# Patient Record
Sex: Female | Born: 1970 | Race: White | Hispanic: No | Marital: Married
Health system: Southern US, Community
[De-identification: ages and names within clinical notes are randomized; demographics above are authoritative.]

---

## 1998-09-20 ENCOUNTER — Encounter: Payer: Self-pay | Admitting: Neurology

## 1998-09-20 ENCOUNTER — Ambulatory Visit (HOSPITAL_COMMUNITY): Admission: RE | Admit: 1998-09-20 | Discharge: 1998-09-20 | Payer: Self-pay | Admitting: Neurology

## 2000-07-11 ENCOUNTER — Other Ambulatory Visit: Admission: RE | Admit: 2000-07-11 | Discharge: 2000-07-11 | Payer: Self-pay | Admitting: Obstetrics and Gynecology

## 2000-07-21 ENCOUNTER — Inpatient Hospital Stay (HOSPITAL_COMMUNITY): Admission: AD | Admit: 2000-07-21 | Discharge: 2000-07-21 | Payer: Self-pay | Admitting: Obstetrics and Gynecology

## 2001-02-03 ENCOUNTER — Encounter (INDEPENDENT_AMBULATORY_CARE_PROVIDER_SITE_OTHER): Payer: Self-pay | Admitting: Specialist

## 2001-02-04 ENCOUNTER — Inpatient Hospital Stay (HOSPITAL_COMMUNITY): Admission: AD | Admit: 2001-02-04 | Discharge: 2001-02-06 | Payer: Self-pay | Admitting: Obstetrics and Gynecology

## 2001-03-24 ENCOUNTER — Encounter: Payer: Self-pay | Admitting: Emergency Medicine

## 2001-03-24 ENCOUNTER — Emergency Department (HOSPITAL_COMMUNITY): Admission: EM | Admit: 2001-03-24 | Discharge: 2001-03-24 | Payer: Self-pay | Admitting: Emergency Medicine

## 2001-04-04 ENCOUNTER — Other Ambulatory Visit: Admission: RE | Admit: 2001-04-04 | Discharge: 2001-04-04 | Payer: Self-pay | Admitting: Obstetrics and Gynecology

## 2002-08-19 ENCOUNTER — Other Ambulatory Visit: Admission: RE | Admit: 2002-08-19 | Discharge: 2002-08-19 | Payer: Self-pay | Admitting: Obstetrics and Gynecology

## 2003-10-12 ENCOUNTER — Other Ambulatory Visit: Admission: RE | Admit: 2003-10-12 | Discharge: 2003-10-12 | Payer: Self-pay | Admitting: Obstetrics and Gynecology

## 2004-02-19 ENCOUNTER — Encounter: Admission: RE | Admit: 2004-02-19 | Discharge: 2004-02-19 | Payer: Self-pay | Admitting: Obstetrics and Gynecology

## 2004-03-18 ENCOUNTER — Encounter: Admission: RE | Admit: 2004-03-18 | Discharge: 2004-03-18 | Payer: Self-pay | Admitting: Obstetrics and Gynecology

## 2004-05-12 ENCOUNTER — Encounter (HOSPITAL_COMMUNITY): Admission: RE | Admit: 2004-05-12 | Discharge: 2004-08-10 | Payer: Self-pay | Admitting: Internal Medicine

## 2005-05-23 ENCOUNTER — Other Ambulatory Visit: Admission: RE | Admit: 2005-05-23 | Discharge: 2005-05-23 | Payer: Self-pay | Admitting: Obstetrics and Gynecology

## 2005-10-04 ENCOUNTER — Encounter (INDEPENDENT_AMBULATORY_CARE_PROVIDER_SITE_OTHER): Payer: Self-pay | Admitting: Specialist

## 2005-10-04 ENCOUNTER — Ambulatory Visit (HOSPITAL_COMMUNITY): Admission: RE | Admit: 2005-10-04 | Discharge: 2005-10-04 | Payer: Self-pay | Admitting: Obstetrics and Gynecology

## 2007-11-29 DIAGNOSIS — M129 Arthropathy, unspecified: Secondary | ICD-10-CM | POA: Insufficient documentation

## 2007-11-29 DIAGNOSIS — R109 Unspecified abdominal pain: Secondary | ICD-10-CM | POA: Insufficient documentation

## 2007-11-29 DIAGNOSIS — E039 Hypothyroidism, unspecified: Secondary | ICD-10-CM | POA: Insufficient documentation

## 2007-11-29 DIAGNOSIS — N809 Endometriosis, unspecified: Secondary | ICD-10-CM | POA: Insufficient documentation

## 2007-12-02 ENCOUNTER — Ambulatory Visit: Payer: Self-pay | Admitting: Internal Medicine

## 2007-12-02 DIAGNOSIS — Z9889 Other specified postprocedural states: Secondary | ICD-10-CM | POA: Insufficient documentation

## 2007-12-05 ENCOUNTER — Ambulatory Visit (HOSPITAL_COMMUNITY): Admission: RE | Admit: 2007-12-05 | Discharge: 2007-12-05 | Payer: Self-pay | Admitting: Internal Medicine

## 2007-12-11 ENCOUNTER — Telehealth: Payer: Self-pay | Admitting: Internal Medicine

## 2010-01-24 IMAGING — US US ABDOMEN COMPLETE
1 series · 14 of 25 positions shown · non-contrast
Comparison: None available

CLINICAL DATA: Right upper abdominal pain

ABDOMEN ULTRASOUND
TECHNIQUE: Complete abdominal ultrasound examination was performed
including evaluation of the liver, gallbladder, bile ducts,
pancreas, kidneys, spleen, IVC, and abdominal aorta.

[Series 1: unknown · 0.27mm/px · 14 of 60 slices shown]
[im 1/60]
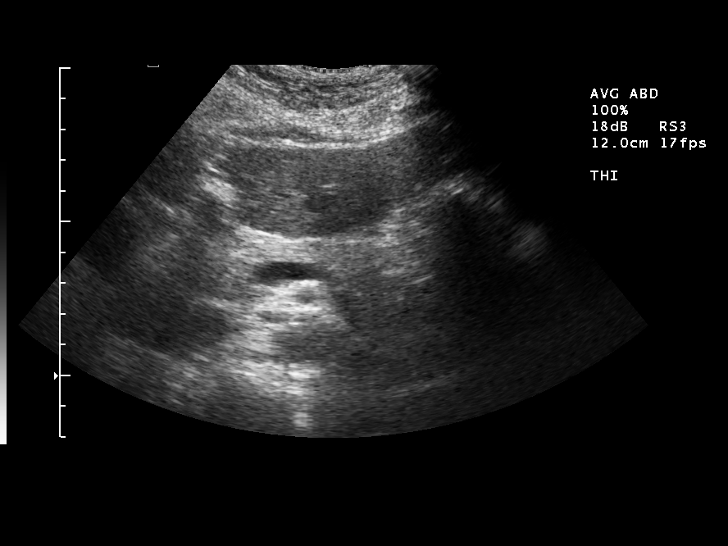
[im 5/60]
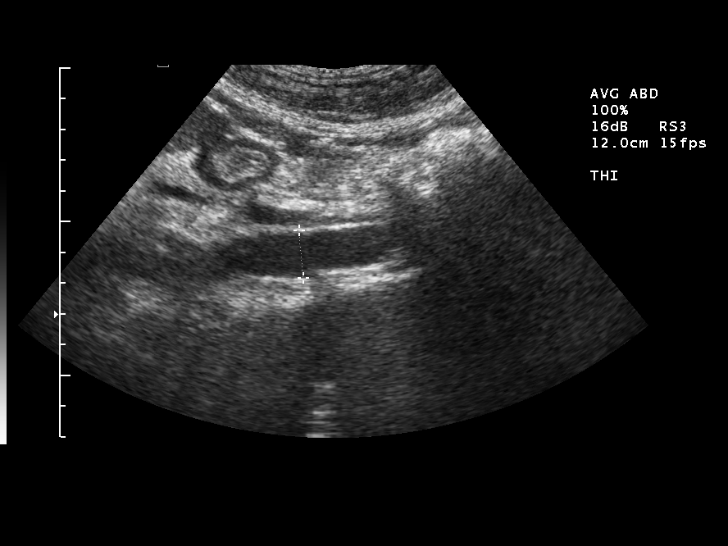
[im 10/60]
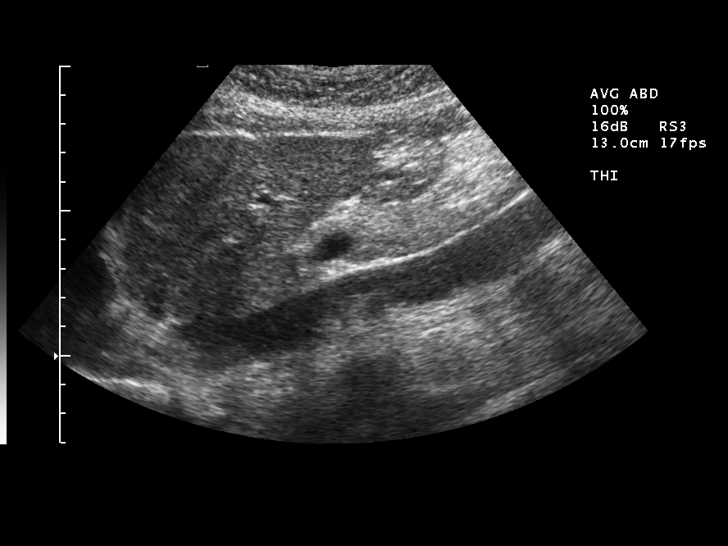
[im 15/60]
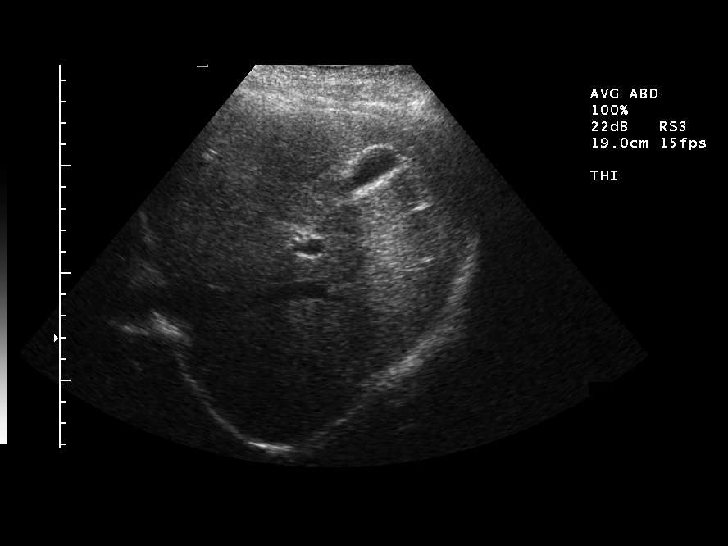
[im 20/60]
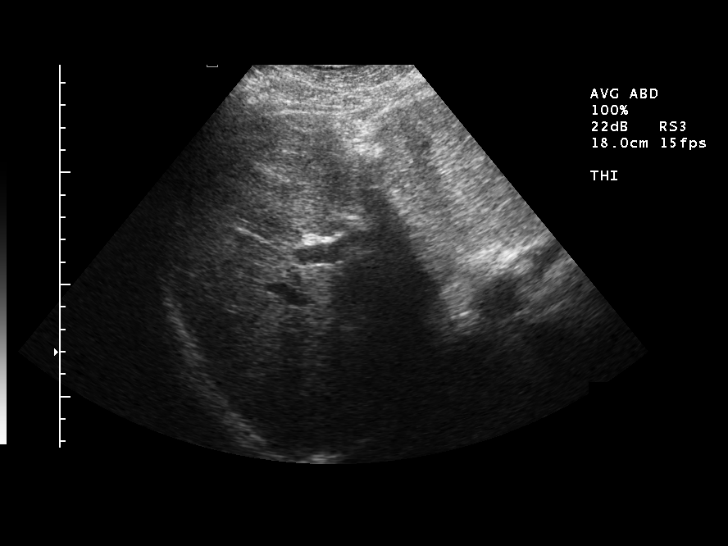
[im 23/60]
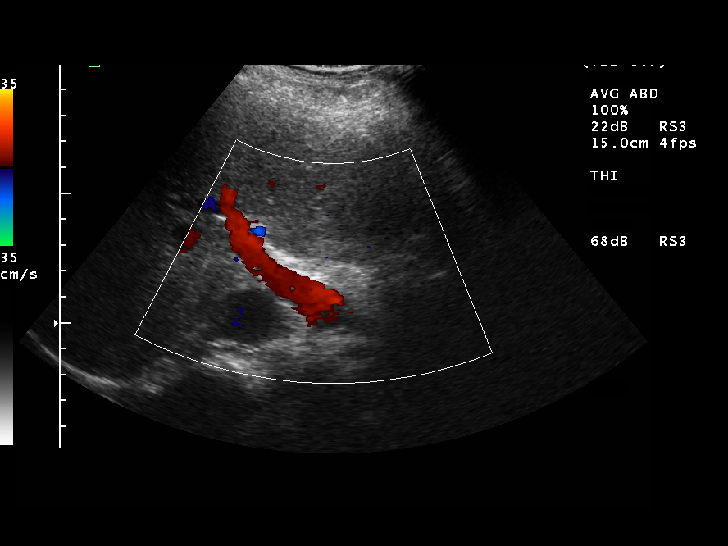
[im 28/60]
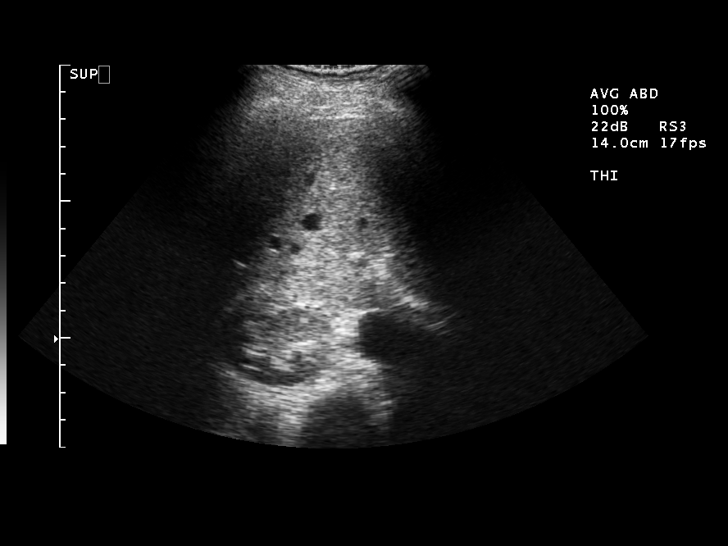
[im 32/60]
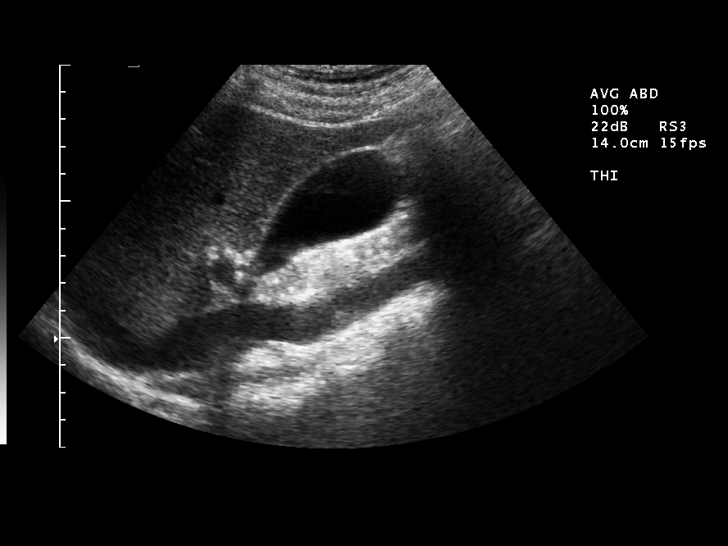
[im 37/60]
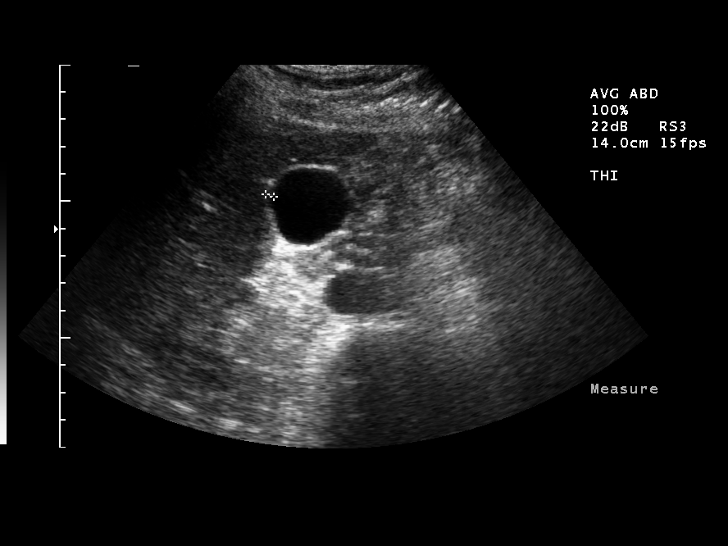
[im 40/60]
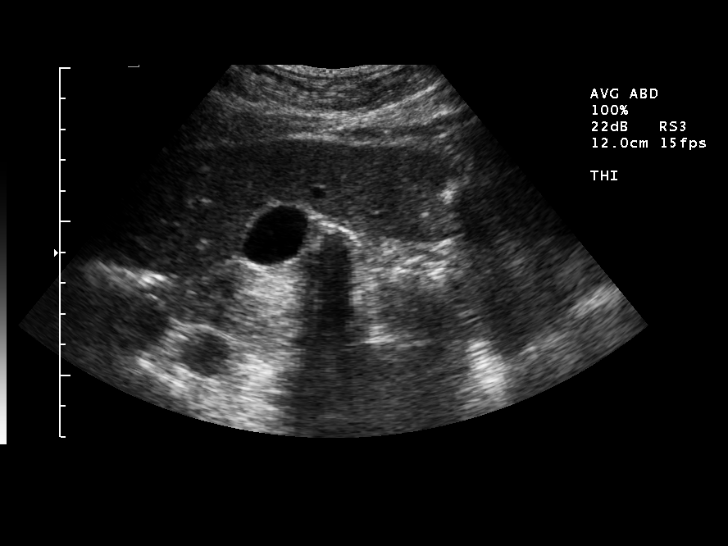
[im 45/60]
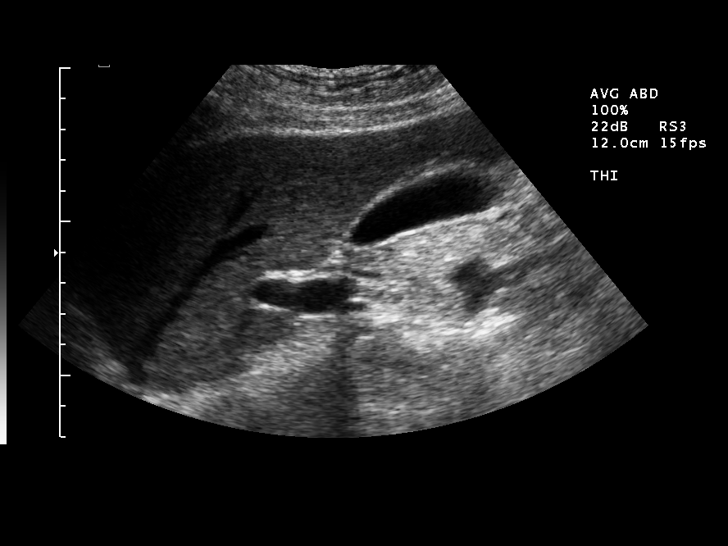
[im 50/60]
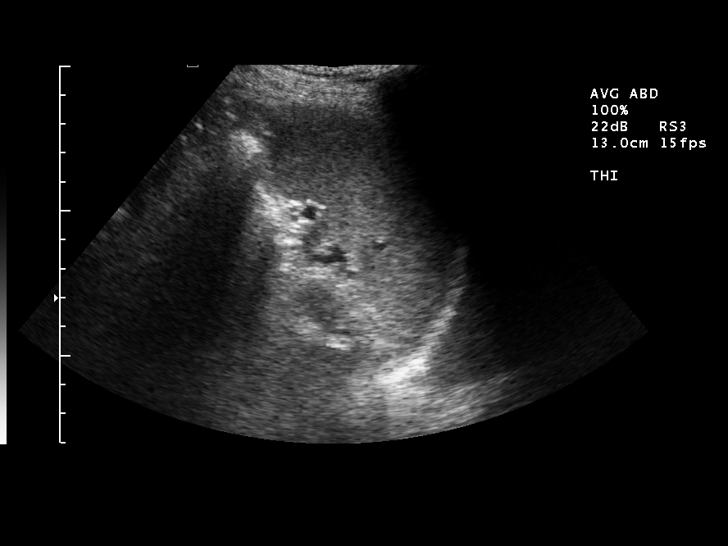
[im 55/60]
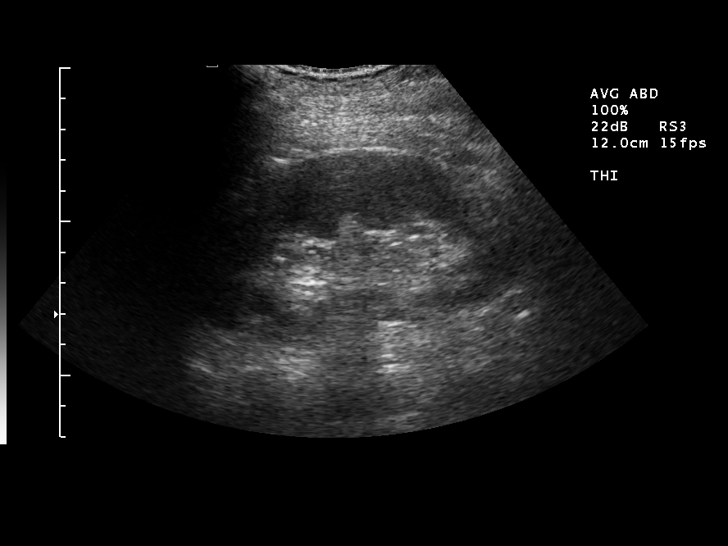
[im 60/60]
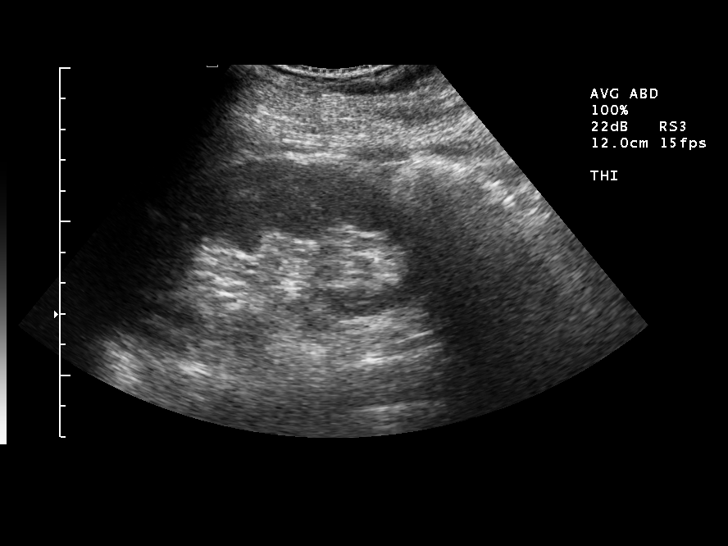

[14 of 25 positions shown; findings below may reference images not displayed]

FINDINGS: Gallbladder:  No gallstones.  No gallbladder wall thickening or
pericholecystic fluid. Negative sonographic Murphy's sign per the
ultrasound technologist.

Common bile duct: Normal in caliber.

Liver:  Normal size and echotexture.  No focal parenchymal
abnormalities.

Inferior vena cava:  Patent.

Pancreas:  Visualized portions unremarkable.

Spleen:  Normal size and echotexture without focal parenchymal
abnormalities.

Right kidney:  No hydronephrosis.   Normal parenchymal echotexture
without focal abnormalities.

Left kidney:  No hydronephrosis. Normal parenchymal echotexture
without focal abnormalities.

Abdominal aorta:  Visualized portions normal in caliber,
unremarkable..
IMPRESSION: Normal abdominal ultrasound.

## 2010-11-11 NOTE — Op Note (Signed)
[name], [name]               ACCOUNT NO.:  [id number]   MEDICAL RECORD NO.:  [id number]          PATIENT TYPE:  AMB   LOCATION:  [facility]                           FACILITY:  [facility]   PHYSICIAN:  Guy Sandifer. Henderson Cloud, M.D. DATE OF BIRTH:  05-Apr-1971   DATE OF PROCEDURE:  10/04/2005  DATE OF DISCHARGE:                                 OPERATIVE REPORT   PREOPERATIVE DIAGNOSES:  1.  Dysmenorrhea.  2.  Menorrhagia.   POSTOPERATIVE DIAGNOSIS:  Endometriosis.   PROCEDURE:  Laparoscopy with ablation of endometriosis, hysteroscopy,  dilatation curettage and 1% Xylocaine paracervical block.   SURGEON:  Guy Sandifer. Henderson Cloud, M.D.   ANESTHESIA:  General with endotracheal intubation.   SPECIMENS:  Endometrial curettings.   ESTIMATED BLOOD LOSS:  Minimal.   I&O OF SORBITOL DISTENDING MEDIA:  40 mL deficit.   INDICATIONS AND CONSENT:  This patient is a 40 year old married white  female, G5, P3, husband status post vasectomy, having premenstrual pain and  heavy menses.  Details are dictated in the history and physical.  Hysteroscopy, dilatation curettage, laparoscopy is discussed.  Potential  risks and complications have been reviewed preoperatively including but  limited to infection, uterine perforation, bowel, bladder or ureteral  damage, bleeding requiring transfusion of blood products with possible  transfusion reaction, HIV and hepatitis acquisition, DVT, PE, pneumonia,  recurrent pain and/or heavy bleeding.  All questions were answered and  consent signed on the chart.   FINDINGS:  Upper abdomen is grossly normal.  Uterus is normal in contour.  Anterior cul-de-sac is normal.  There are red to black implants of  endometriosis in the posterior cul-de-sac, on the left uterosacral ligament  and a single dark, black implant of endometriosis on the left ovary.  There  is a 2 cm translucent paratubal cyst on the left fallopian tube.   PROCEDURE:  The patient is taken to the operating room,  where she is  identified and placed in the dorsal supine position and general anesthesia  was induced via endotracheal intubation.  She is then placed in the dorsal  lithotomy position, where she is prepped abdominally and vaginally, the  bladder is straight catheterized and she is draped in a sterile fashion.  A  bivalve speculum is placed in vagina and the anterior cervical lip is  injected with 1% Xylocaine and grasped with a single-tooth tenaculum.  A  paracervical block was placed at the 2, 4, 5, 7, 8 and 10 o'clock positions  with approximately 20 mL total of 1% plain Xylocaine.  The cervix is gently  progressively dilated to a 27 dilator.  The diagnostic hysteroscope is  placed in the endocervical canal and advanced under direct visualization.  No abnormal structures were noted.  Hysteroscope is removed.  Sharp  curettage is carried out.  Hysteroscope is then once again advanced under  direct visualization using sorbitol distending media into endometrial  cavity, and no abnormal structures were noted.  The hysteroscope is removed  and the single-tooth tenaculum is replaced with a Hulka tenaculum.  Attention is then turned to the abdomen.  The infraumbilical area and  the  suprapubic area are injected in the midline with 0.5% plain Marcaine.  Small  infraumbilical incision is made and a disposable Veress needle is placed  with a normal syringe and drop test.  Two liters of gas are insufflated  under low pressure with good tympany in the right upper quadrant.  The  Veress needle was removed and a 10/11 bladeless disposable XL trocar sleeve  [place] placed using direct visualization using the diagnostic laparoscopic.  Diagnostic scope is then replaced with the operative laparoscope and a small  suprapubic incision is made.  A 5 mm disposable bladeless XL trocar is then  placed under direct visualization without difficulty.  The above findings  were noted.  The bipolar cautery is used to  ablate the areas of  endometriosis.  Care is taken to avoid the course of the ureter.  The left  paratubal cyst is very thin-walled and simply breaks when grasped and  drains.  The suprapubic trocar sleeve is then removed.  Good hemostasis is  noted all around.  The pneumoperitoneum is completely reduced, the umbilical  trocar sleeve is removed.  Vicryl 0 suture is used in subcutaneous stitch to  reapproximate the umbilical incision.  Dermabond is placed on both  incisions.  The Hulka tenaculum is removed and good hemostasis is noted.  All counts are correct.  The patient is transferred to the recovery room in  stable condition.      Guy Sandifer Henderson Cloud, M.D.  Electronically Signed     JET/MEDQ  D:  10/04/2005  T:  10/04/2005  Job:  [id number]

## 2010-11-11 NOTE — H&P (Signed)
[name], [name]               ACCOUNT NO.:  [id number]   MEDICAL RECORD NO.:  [id number]          PATIENT TYPE:  AMB   LOCATION:  [facility]                           FACILITY:  [facility]   PHYSICIAN:  Guy Sandifer. Henderson Cloud, M.D. DATE OF BIRTH:  18-Dec-1970   DATE OF ADMISSION:  10/04/2005  DATE OF DISCHARGE:                                HISTORY & PHYSICAL   CHIEF COMPLAINT:  Painful, heavy menses.   HISTORY OF PRESENT ILLNESS:  This patient is a 40 year old married white  female, G5, P3, husband status post vasectomy, who is having predictable  premenstrual pain three days before her menses.  Menses are also becoming  increasingly heavy for at least two days every cycle.  Ultrasound in  February of this year was consistent with a 1.1 cm polypoid-type mass in the  endometrial cavity and a 1.7 hemorrhagic cyst on the left ovary.  After  discussion of options, she is being admitted for laparoscopy, hysteroscopy,  dilatation and curettage.  Potential risks and complications have been  discussed preoperatively.   PAST MEDICAL HISTORY:  1.  Hypothyroidism.  2.  Kidney stones in 1997.  3.  History of migraines.  4.  Pneumonia, age 5.   FAMILY HISTORY:  Heart disease, maternal grandmother, maternal grandfather.  Chronic hypertension, maternal grandmother, maternal grandfather, mother,  father.  Migraine headaches, diabetes, maternal grandmother.   OBSTETRICAL HISTORY:  Vaginal deliveries x3.  Miscarriage x2.   SOCIAL HISTORY:  Denies tobacco, alcohol, or drug abuse.   MEDICATIONS:  Synthroid daily.  Ambien p.r.n.   ALLERGIES:  CODEINE leading to hives.   REVIEW OF SYSTEMS:  NEURO:  History of migraine.  CARDIO:  Denies chest  pain.  PULMONARY:  Denies shortness of breath.  GI:  Denies recent changes  in bowel habits.   PHYSICAL EXAMINATION:  VITAL SIGNS:  Height 5 feet 9 inches.  Weight 138-1/2  pounds.  Blood pressure 96/66.  HEENT:  NECK:  Without thyromegaly.  LUNGS:  Clear to  auscultation.  HEART:  Regular rate and rhythm.  BACK:  Without CVA tenderness.  BREASTS:  Without mass, __________, discharge.  ABDOMEN:  Soft and nontender without masses.  PELVIC:  Vulva, vagina, and cervix without lesion.  Uterus is retroverted,  upper normal size.  Mobile.  Mildly tender.  Adnexa nontender without  masses.  EXTREMITIES:  Grossly within normal limits.  NEUROLOGIC:  Grossly within normal limits.   ASSESSMENT:  Dysmenorrhea and menorrhagia.   PLAN:  Laparoscopy, hysteroscopy, D&C.      Guy Sandifer Henderson Cloud, M.D.  Electronically Signed     JET/MEDQ  D:  10/02/2005  T:  10/02/2005  Job:  [id number]

## 2014-06-04 ENCOUNTER — Other Ambulatory Visit: Payer: Self-pay | Admitting: Obstetrics and Gynecology

## 2014-06-05 LAB — CYTOLOGY - PAP

## 2015-11-18 DIAGNOSIS — E038 Other specified hypothyroidism: Secondary | ICD-10-CM | POA: Diagnosis not present

## 2015-11-18 DIAGNOSIS — R61 Generalized hyperhidrosis: Secondary | ICD-10-CM | POA: Diagnosis not present

## 2015-11-18 DIAGNOSIS — R631 Polydipsia: Secondary | ICD-10-CM | POA: Diagnosis not present

## 2015-11-18 DIAGNOSIS — N926 Irregular menstruation, unspecified: Secondary | ICD-10-CM | POA: Diagnosis not present

## 2015-12-15 ENCOUNTER — Other Ambulatory Visit: Payer: Self-pay | Admitting: Internal Medicine

## 2015-12-15 ENCOUNTER — Other Ambulatory Visit: Payer: Self-pay

## 2015-12-15 DIAGNOSIS — G43909 Migraine, unspecified, not intractable, without status migrainosus: Secondary | ICD-10-CM | POA: Diagnosis not present

## 2015-12-15 DIAGNOSIS — R42 Dizziness and giddiness: Secondary | ICD-10-CM

## 2015-12-15 DIAGNOSIS — Z6822 Body mass index (BMI) 22.0-22.9, adult: Secondary | ICD-10-CM | POA: Diagnosis not present

## 2015-12-15 DIAGNOSIS — G43919 Migraine, unspecified, intractable, without status migrainosus: Secondary | ICD-10-CM

## 2015-12-17 ENCOUNTER — Ambulatory Visit
Admission: RE | Admit: 2015-12-17 | Discharge: 2015-12-17 | Disposition: A | Discharge status: Home / Self Care | Payer: BLUE CROSS/BLUE SHIELD | Source: Ambulatory Visit | Attending: Internal Medicine | Admitting: Internal Medicine

## 2015-12-17 DIAGNOSIS — G43919 Migraine, unspecified, intractable, without status migrainosus: Secondary | ICD-10-CM

## 2015-12-17 DIAGNOSIS — R42 Dizziness and giddiness: Secondary | ICD-10-CM

## 2016-01-03 DIAGNOSIS — E038 Other specified hypothyroidism: Secondary | ICD-10-CM | POA: Diagnosis not present

## 2016-03-09 DIAGNOSIS — Z3202 Encounter for pregnancy test, result negative: Secondary | ICD-10-CM | POA: Diagnosis not present

## 2016-03-09 DIAGNOSIS — Z113 Encounter for screening for infections with a predominantly sexual mode of transmission: Secondary | ICD-10-CM | POA: Diagnosis not present

## 2016-03-09 DIAGNOSIS — N760 Acute vaginitis: Secondary | ICD-10-CM | POA: Diagnosis not present

## 2016-03-09 DIAGNOSIS — N939 Abnormal uterine and vaginal bleeding, unspecified: Secondary | ICD-10-CM | POA: Diagnosis not present

## 2016-04-10 DIAGNOSIS — Z6822 Body mass index (BMI) 22.0-22.9, adult: Secondary | ICD-10-CM | POA: Diagnosis not present

## 2016-04-10 DIAGNOSIS — G43909 Migraine, unspecified, not intractable, without status migrainosus: Secondary | ICD-10-CM | POA: Diagnosis not present

## 2016-04-10 DIAGNOSIS — F418 Other specified anxiety disorders: Secondary | ICD-10-CM | POA: Diagnosis not present

## 2016-04-11 ENCOUNTER — Other Ambulatory Visit: Payer: Self-pay | Admitting: Internal Medicine

## 2016-04-11 DIAGNOSIS — G43009 Migraine without aura, not intractable, without status migrainosus: Secondary | ICD-10-CM

## 2016-05-01 DIAGNOSIS — Z6824 Body mass index (BMI) 24.0-24.9, adult: Secondary | ICD-10-CM | POA: Diagnosis not present

## 2016-05-01 DIAGNOSIS — Z1231 Encounter for screening mammogram for malignant neoplasm of breast: Secondary | ICD-10-CM | POA: Diagnosis not present

## 2016-05-01 DIAGNOSIS — Z01419 Encounter for gynecological examination (general) (routine) without abnormal findings: Secondary | ICD-10-CM | POA: Diagnosis not present

## 2016-07-16 DIAGNOSIS — S52125A Nondisplaced fracture of head of left radius, initial encounter for closed fracture: Secondary | ICD-10-CM | POA: Diagnosis not present

## 2016-07-20 ENCOUNTER — Telehealth (INDEPENDENT_AMBULATORY_CARE_PROVIDER_SITE_OTHER): Payer: Self-pay | Admitting: *Deleted

## 2016-07-20 ENCOUNTER — Ambulatory Visit (INDEPENDENT_AMBULATORY_CARE_PROVIDER_SITE_OTHER): Payer: BLUE CROSS/BLUE SHIELD | Admitting: Family

## 2016-07-20 ENCOUNTER — Ambulatory Visit (INDEPENDENT_AMBULATORY_CARE_PROVIDER_SITE_OTHER): Payer: Self-pay | Admitting: Family

## 2016-07-20 ENCOUNTER — Encounter (INDEPENDENT_AMBULATORY_CARE_PROVIDER_SITE_OTHER): Payer: Self-pay

## 2016-07-20 ENCOUNTER — Ambulatory Visit (INDEPENDENT_AMBULATORY_CARE_PROVIDER_SITE_OTHER): Payer: BLUE CROSS/BLUE SHIELD

## 2016-07-20 VITALS — Ht 69.0 in | Wt 151.0 lb

## 2016-07-20 DIAGNOSIS — M25522 Pain in left elbow: Secondary | ICD-10-CM

## 2016-07-20 NOTE — Progress Notes (Signed)
Office Visit Note   Patient: [name]           Date of Birth: 19-May-1971           MRN: [id number] Visit Date: 07/20/2016              Requested by: No referring provider defined for this encounter. PCP: Gaspar Garbeichard W Tisovec, MD  Chief Complaint  Patient presents with  . Left Elbow - Pain, Injury    DOI 07/15/16 pt fell down while ice skating and landed on left elbow.     HPI: Patient states that she fell on Saturday awhile ice skating and landed on her left elbow. She went to an urgent care where the obtained x rays and stated that she had sustained a fracture. The patient states that she was placed in a brace and sling but that she removed them both because "they were too tight" the has not kept the limb immobile and is in the office for eval today. [name], RMA   Patient is a 46 year old woman who is seen today for initial evaluation of left elbow pain. She was ice skating on Saturday when she fell and landed directly onto her left elbow. She was initially evaluated at an urgent care and advised they could not rule out a fracture. Recommended follow-up with orthopedics. Repeat radiographs will be done today. She has not been using a sling filled this was uncomfortable and tight. Has been working on range of motion of the elbow. States it does feel stiff with extension. Minimal him discomfort at rest. Does complain of tenderness and bruising. Some aching pain in the biceps.     Assessment & Plan: Visit Diagnoses:  1. Pain in left elbow     Plan: Conservative management. Have recommended that she use ibuprofen or Aleve as needed for pain. Work on range of motion. May use heat pack to break up the hematoma. patient understands and will follow up office if symptoms fail to improve.  Follow-Up Instructions: Return in about 3 weeks (around 08/10/2016), or if symptoms worsen or fail to improve.   Left Elbow Exam   Tenderness  The patient is experiencing tenderness in the  medial epicondyle.   Range of Motion  The patient has normal left elbow ROM.  Muscle Strength  The patient has normal left elbow strength.  Other  Sensation: normal Pulse: present  Comments:  Ecchymosis over medial upper arm and epicondyle which is tender. Moderate swelling. Compartments are soft.   Left Shoulder Exam  Left shoulder exam is normal.  Tenderness  The patient is experiencing no tenderness.     Range of Motion  The patient has normal left shoulder ROM.  Muscle Strength  The patient has normal left shoulder strength.     Imaging: No results found.  Orders:  Orders Placed This Encounter  Procedures  . XR Elbow 2 Views Left   No orders of the defined types were placed in this encounter.    Procedures: No procedures performed  Clinical Data: No additional findings.  Subjective: Review of Systems  Constitutional: Negative for chills and fever.  Musculoskeletal: Positive for myalgias.  Skin: Positive for color change. Negative for wound.  Neurological: Negative for numbness.    Objective: Vital Signs: Ht 5\' 9"  (1.753 m)   Wt 151 lb (68.5 kg)   BMI 22.30 kg/m   Specialty Comments:  No specialty comments available.  PMFS History: Patient Active Problem List  Diagnosis Date Noted  . OTHER POSTSURGICAL STATUS OTHER 12/02/2007  . HYPOTHYROIDISM 11/29/2007  . ENDOMETRIOSIS, SITE UNSPECIFIED 11/29/2007  . ARTHRITIS 11/29/2007  . ABDOMINAL PAIN, UNSPECIFIED SITE 11/29/2007   No past medical history on file.  No family history on file.  No past surgical history on file. Social History   Occupational History  . Not on file.   Social History Main Topics  . Smoking status: Not on file  . Smokeless tobacco: Not on file  . Alcohol use Not on file  . Drug use: Unknown  . Sexual activity: Not on file

## 2016-07-20 NOTE — Telephone Encounter (Signed)
ERROR

## 2016-10-03 DIAGNOSIS — E038 Other specified hypothyroidism: Secondary | ICD-10-CM | POA: Diagnosis not present

## 2016-10-03 DIAGNOSIS — F418 Other specified anxiety disorders: Secondary | ICD-10-CM | POA: Diagnosis not present

## 2016-10-03 DIAGNOSIS — K219 Gastro-esophageal reflux disease without esophagitis: Secondary | ICD-10-CM | POA: Diagnosis not present

## 2016-10-03 DIAGNOSIS — G43909 Migraine, unspecified, not intractable, without status migrainosus: Secondary | ICD-10-CM | POA: Diagnosis not present

## 2016-10-03 DIAGNOSIS — N926 Irregular menstruation, unspecified: Secondary | ICD-10-CM | POA: Diagnosis not present

## 2017-07-16 DIAGNOSIS — J22 Unspecified acute lower respiratory infection: Secondary | ICD-10-CM | POA: Diagnosis not present

## 2017-07-16 DIAGNOSIS — R05 Cough: Secondary | ICD-10-CM | POA: Diagnosis not present

## 2017-07-16 DIAGNOSIS — E039 Hypothyroidism, unspecified: Secondary | ICD-10-CM | POA: Diagnosis not present

## 2017-09-13 DIAGNOSIS — E039 Hypothyroidism, unspecified: Secondary | ICD-10-CM | POA: Diagnosis not present

## 2017-09-13 DIAGNOSIS — Z8781 Personal history of (healed) traumatic fracture: Secondary | ICD-10-CM | POA: Diagnosis not present

## 2017-09-13 DIAGNOSIS — M25521 Pain in right elbow: Secondary | ICD-10-CM | POA: Diagnosis not present

## 2017-09-13 DIAGNOSIS — M79631 Pain in right forearm: Secondary | ICD-10-CM | POA: Diagnosis not present

## 2018-03-05 DIAGNOSIS — E039 Hypothyroidism, unspecified: Secondary | ICD-10-CM | POA: Diagnosis not present

## 2018-03-05 DIAGNOSIS — Z0000 Encounter for general adult medical examination without abnormal findings: Secondary | ICD-10-CM | POA: Diagnosis not present

## 2018-03-25 DIAGNOSIS — G4700 Insomnia, unspecified: Secondary | ICD-10-CM | POA: Diagnosis not present

## 2018-03-25 DIAGNOSIS — E039 Hypothyroidism, unspecified: Secondary | ICD-10-CM | POA: Diagnosis not present

## 2018-03-25 DIAGNOSIS — Z23 Encounter for immunization: Secondary | ICD-10-CM | POA: Diagnosis not present

## 2018-03-25 DIAGNOSIS — Z0000 Encounter for general adult medical examination without abnormal findings: Secondary | ICD-10-CM | POA: Diagnosis not present

## 2018-04-09 DIAGNOSIS — H524 Presbyopia: Secondary | ICD-10-CM | POA: Diagnosis not present

## 2018-04-09 DIAGNOSIS — R51 Headache: Secondary | ICD-10-CM | POA: Diagnosis not present

## 2018-05-27 DIAGNOSIS — E039 Hypothyroidism, unspecified: Secondary | ICD-10-CM | POA: Diagnosis not present

## 2018-06-04 DIAGNOSIS — Z01419 Encounter for gynecological examination (general) (routine) without abnormal findings: Secondary | ICD-10-CM | POA: Diagnosis not present

## 2018-06-04 DIAGNOSIS — Z6827 Body mass index (BMI) 27.0-27.9, adult: Secondary | ICD-10-CM | POA: Diagnosis not present

## 2018-06-04 DIAGNOSIS — Z1231 Encounter for screening mammogram for malignant neoplasm of breast: Secondary | ICD-10-CM | POA: Diagnosis not present

## 2018-08-07 DIAGNOSIS — Z0000 Encounter for general adult medical examination without abnormal findings: Secondary | ICD-10-CM | POA: Diagnosis not present

## 2018-08-08 DIAGNOSIS — Z0000 Encounter for general adult medical examination without abnormal findings: Secondary | ICD-10-CM | POA: Diagnosis not present

## 2018-08-08 DIAGNOSIS — Z13220 Encounter for screening for lipoid disorders: Secondary | ICD-10-CM | POA: Diagnosis not present

## 2018-08-14 DIAGNOSIS — E039 Hypothyroidism, unspecified: Secondary | ICD-10-CM | POA: Diagnosis not present

## 2018-08-14 DIAGNOSIS — L249 Irritant contact dermatitis, unspecified cause: Secondary | ICD-10-CM | POA: Diagnosis not present

## 2018-08-14 DIAGNOSIS — R002 Palpitations: Secondary | ICD-10-CM | POA: Diagnosis not present

## 2018-08-14 DIAGNOSIS — Z0000 Encounter for general adult medical examination without abnormal findings: Secondary | ICD-10-CM | POA: Diagnosis not present

## 2018-08-14 DIAGNOSIS — G4700 Insomnia, unspecified: Secondary | ICD-10-CM | POA: Diagnosis not present

## 2018-08-21 ENCOUNTER — Other Ambulatory Visit (HOSPITAL_COMMUNITY): Payer: Self-pay | Admitting: Internal Medicine

## 2018-08-21 ENCOUNTER — Other Ambulatory Visit: Payer: Self-pay | Admitting: Internal Medicine

## 2018-08-21 DIAGNOSIS — R002 Palpitations: Secondary | ICD-10-CM

## 2018-08-22 ENCOUNTER — Ambulatory Visit (HOSPITAL_COMMUNITY): Payer: BLUE CROSS/BLUE SHIELD | Attending: Internal Medicine

## 2018-08-22 DIAGNOSIS — R002 Palpitations: Secondary | ICD-10-CM

## 2018-08-26 DIAGNOSIS — R002 Palpitations: Secondary | ICD-10-CM | POA: Diagnosis not present

## 2018-09-13 DIAGNOSIS — R002 Palpitations: Secondary | ICD-10-CM | POA: Diagnosis not present

## 2018-09-17 DIAGNOSIS — R002 Palpitations: Secondary | ICD-10-CM | POA: Diagnosis not present

## 2018-09-17 DIAGNOSIS — E039 Hypothyroidism, unspecified: Secondary | ICD-10-CM | POA: Diagnosis not present

## 2018-12-04 DIAGNOSIS — E039 Hypothyroidism, unspecified: Secondary | ICD-10-CM | POA: Diagnosis not present

## 2019-08-01 DIAGNOSIS — E039 Hypothyroidism, unspecified: Secondary | ICD-10-CM | POA: Diagnosis not present

## 2019-08-01 DIAGNOSIS — Z6827 Body mass index (BMI) 27.0-27.9, adult: Secondary | ICD-10-CM | POA: Diagnosis not present

## 2019-08-01 DIAGNOSIS — Z01419 Encounter for gynecological examination (general) (routine) without abnormal findings: Secondary | ICD-10-CM | POA: Diagnosis not present

## 2019-08-01 DIAGNOSIS — Z113 Encounter for screening for infections with a predominantly sexual mode of transmission: Secondary | ICD-10-CM | POA: Diagnosis not present

## 2019-08-01 DIAGNOSIS — N941 Unspecified dyspareunia: Secondary | ICD-10-CM | POA: Diagnosis not present

## 2019-10-17 DIAGNOSIS — E039 Hypothyroidism, unspecified: Secondary | ICD-10-CM | POA: Diagnosis not present

## 2019-10-17 DIAGNOSIS — G4700 Insomnia, unspecified: Secondary | ICD-10-CM | POA: Diagnosis not present

## 2019-10-17 DIAGNOSIS — G43009 Migraine without aura, not intractable, without status migrainosus: Secondary | ICD-10-CM | POA: Diagnosis not present

## 2019-10-17 DIAGNOSIS — F4321 Adjustment disorder with depressed mood: Secondary | ICD-10-CM | POA: Diagnosis not present

## 2019-11-19 DIAGNOSIS — E039 Hypothyroidism, unspecified: Secondary | ICD-10-CM | POA: Diagnosis not present

## 2020-02-11 DIAGNOSIS — R946 Abnormal results of thyroid function studies: Secondary | ICD-10-CM | POA: Diagnosis not present

## 2020-02-11 DIAGNOSIS — E039 Hypothyroidism, unspecified: Secondary | ICD-10-CM | POA: Diagnosis not present

## 2020-04-01 DIAGNOSIS — E063 Autoimmune thyroiditis: Secondary | ICD-10-CM | POA: Diagnosis not present

## 2020-04-01 DIAGNOSIS — G43009 Migraine without aura, not intractable, without status migrainosus: Secondary | ICD-10-CM | POA: Diagnosis not present

## 2020-04-01 DIAGNOSIS — G4700 Insomnia, unspecified: Secondary | ICD-10-CM | POA: Diagnosis not present

## 2020-04-01 DIAGNOSIS — Z0000 Encounter for general adult medical examination without abnormal findings: Secondary | ICD-10-CM | POA: Diagnosis not present

## 2020-04-01 DIAGNOSIS — F4321 Adjustment disorder with depressed mood: Secondary | ICD-10-CM | POA: Diagnosis not present

## 2020-05-06 DIAGNOSIS — Z20828 Contact with and (suspected) exposure to other viral communicable diseases: Secondary | ICD-10-CM | POA: Diagnosis not present

## 2020-05-21 DIAGNOSIS — Z20828 Contact with and (suspected) exposure to other viral communicable diseases: Secondary | ICD-10-CM | POA: Diagnosis not present

## 2022-08-16 DIAGNOSIS — G43009 Migraine without aura, not intractable, without status migrainosus: Secondary | ICD-10-CM | POA: Diagnosis not present

## 2022-08-16 DIAGNOSIS — G4700 Insomnia, unspecified: Secondary | ICD-10-CM | POA: Diagnosis not present

## 2022-08-16 DIAGNOSIS — F419 Anxiety disorder, unspecified: Secondary | ICD-10-CM | POA: Diagnosis not present

## 2022-09-27 DIAGNOSIS — J3489 Other specified disorders of nose and nasal sinuses: Secondary | ICD-10-CM | POA: Diagnosis not present

## 2022-09-27 DIAGNOSIS — J0100 Acute maxillary sinusitis, unspecified: Secondary | ICD-10-CM | POA: Diagnosis not present

## 2022-12-25 DIAGNOSIS — C44529 Squamous cell carcinoma of skin of other part of trunk: Secondary | ICD-10-CM | POA: Diagnosis not present

## 2022-12-25 DIAGNOSIS — D492 Neoplasm of unspecified behavior of bone, soft tissue, and skin: Secondary | ICD-10-CM | POA: Diagnosis not present

## 2022-12-25 DIAGNOSIS — L821 Other seborrheic keratosis: Secondary | ICD-10-CM | POA: Diagnosis not present

## 2022-12-25 DIAGNOSIS — B079 Viral wart, unspecified: Secondary | ICD-10-CM | POA: Diagnosis not present

## 2023-01-19 DIAGNOSIS — E663 Overweight: Secondary | ICD-10-CM | POA: Diagnosis not present

## 2023-01-19 DIAGNOSIS — E063 Autoimmune thyroiditis: Secondary | ICD-10-CM | POA: Diagnosis not present

## 2023-01-19 DIAGNOSIS — G43009 Migraine without aura, not intractable, without status migrainosus: Secondary | ICD-10-CM | POA: Diagnosis not present

## 2023-01-19 DIAGNOSIS — Z111 Encounter for screening for respiratory tuberculosis: Secondary | ICD-10-CM | POA: Diagnosis not present

## 2023-01-19 DIAGNOSIS — R350 Frequency of micturition: Secondary | ICD-10-CM | POA: Diagnosis not present

## 2023-01-19 DIAGNOSIS — G4700 Insomnia, unspecified: Secondary | ICD-10-CM | POA: Diagnosis not present

## 2023-01-22 DIAGNOSIS — L905 Scar conditions and fibrosis of skin: Secondary | ICD-10-CM | POA: Diagnosis not present

## 2023-01-22 DIAGNOSIS — C44529 Squamous cell carcinoma of skin of other part of trunk: Secondary | ICD-10-CM | POA: Diagnosis not present

## 2023-06-15 DIAGNOSIS — E039 Hypothyroidism, unspecified: Secondary | ICD-10-CM | POA: Diagnosis not present

## 2023-06-15 DIAGNOSIS — G4700 Insomnia, unspecified: Secondary | ICD-10-CM | POA: Diagnosis not present

## 2023-06-15 DIAGNOSIS — Z780 Asymptomatic menopausal state: Secondary | ICD-10-CM | POA: Diagnosis not present

## 2023-06-15 DIAGNOSIS — F419 Anxiety disorder, unspecified: Secondary | ICD-10-CM | POA: Diagnosis not present

## 2023-06-15 DIAGNOSIS — G43009 Migraine without aura, not intractable, without status migrainosus: Secondary | ICD-10-CM | POA: Diagnosis not present

## 2023-07-05 DIAGNOSIS — S2232XA Fracture of one rib, left side, initial encounter for closed fracture: Secondary | ICD-10-CM | POA: Diagnosis not present

## 2023-07-05 DIAGNOSIS — R0781 Pleurodynia: Secondary | ICD-10-CM | POA: Diagnosis not present

## 2023-10-30 DIAGNOSIS — E039 Hypothyroidism, unspecified: Secondary | ICD-10-CM | POA: Diagnosis not present

## 2024-02-22 DIAGNOSIS — R399 Unspecified symptoms and signs involving the genitourinary system: Secondary | ICD-10-CM | POA: Diagnosis not present

## 2024-02-22 DIAGNOSIS — R300 Dysuria: Secondary | ICD-10-CM | POA: Diagnosis not present

## 2024-02-22 DIAGNOSIS — E063 Autoimmune thyroiditis: Secondary | ICD-10-CM | POA: Diagnosis not present

## 2024-05-19 ENCOUNTER — Other Ambulatory Visit (HOSPITAL_BASED_OUTPATIENT_CLINIC_OR_DEPARTMENT_OTHER): Payer: Self-pay | Admitting: Family Medicine

## 2024-05-19 DIAGNOSIS — E781 Pure hyperglyceridemia: Secondary | ICD-10-CM

## 2024-06-09 ENCOUNTER — Other Ambulatory Visit (HOSPITAL_BASED_OUTPATIENT_CLINIC_OR_DEPARTMENT_OTHER): Payer: Self-pay

## 2024-06-09 ENCOUNTER — Encounter (HOSPITAL_BASED_OUTPATIENT_CLINIC_OR_DEPARTMENT_OTHER): Payer: Self-pay

## 2024-07-25 ENCOUNTER — Other Ambulatory Visit (HOSPITAL_COMMUNITY): Payer: Self-pay

## 2024-07-25 DIAGNOSIS — E781 Pure hyperglyceridemia: Secondary | ICD-10-CM

## 2024-08-22 ENCOUNTER — Other Ambulatory Visit (HOSPITAL_BASED_OUTPATIENT_CLINIC_OR_DEPARTMENT_OTHER)
